# Patient Record
Sex: Male | Born: 1995 | Race: Black or African American | Hispanic: No | Marital: Single | Smoking: Never smoker
Health system: Southern US, Community
[De-identification: ages and names within clinical notes are randomized; demographics above are authoritative.]

---
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
## 2018-06-24 ENCOUNTER — Other Ambulatory Visit: Payer: Self-pay

## 2018-06-24 ENCOUNTER — Encounter (HOSPITAL_COMMUNITY): Payer: Self-pay | Admitting: Emergency Medicine

## 2018-06-24 ENCOUNTER — Emergency Department (HOSPITAL_COMMUNITY)
Admission: EM | Admit: 2018-06-24 | Discharge: 2018-06-24 | Disposition: A | Discharge status: Home / Self Care | Payer: Self-pay | Attending: Emergency Medicine | Admitting: Emergency Medicine

## 2018-06-24 ENCOUNTER — Emergency Department (HOSPITAL_COMMUNITY): Payer: Self-pay

## 2018-06-24 DIAGNOSIS — J36 Peritonsillar abscess: Secondary | ICD-10-CM | POA: Insufficient documentation

## 2018-06-24 LAB — I-STAT CHEM 8, ED
BUN: 10 mg/dL (ref 6–20)
CALCIUM ION: 1.16 mmol/L (ref 1.15–1.40)
Chloride: 100 mmol/L (ref 98–111)
Creatinine, Ser: 0.8 mg/dL (ref 0.61–1.24)
GLUCOSE: 89 mg/dL (ref 70–99)
HEMATOCRIT: 46 % (ref 39.0–52.0)
Hemoglobin: 15.6 g/dL (ref 13.0–17.0)
Potassium: 3.8 mmol/L (ref 3.5–5.1)
SODIUM: 138 mmol/L (ref 135–145)
TCO2: 27 mmol/L (ref 22–32)

## 2018-06-24 LAB — GROUP A STREP BY PCR: GROUP A STREP BY PCR: NOT DETECTED

## 2018-06-24 MED ORDER — IOHEXOL 300 MG/ML  SOLN
100.0000 mL | Freq: Once | INTRAMUSCULAR | Status: AC | PRN
  Administered 2018-06-24: 100 mL via INTRAVENOUS

## 2018-06-24 MED ORDER — CLINDAMYCIN HCL 300 MG PO CAPS: 300.0000 mg | ORAL_CAPSULE | Freq: Three times a day (TID) | ORAL | 0 refills | Status: AC

## 2018-06-24 MED ORDER — ACETAMINOPHEN 325 MG PO TABS
650.0000 mg | ORAL_TABLET | Freq: Once | ORAL | Status: AC
Start: 2018-06-24 — End: 2018-06-24
  Administered 2018-06-24: 650 mg via ORAL
  Filled 2018-06-24: qty 2

## 2018-06-24 MED ORDER — CLINDAMYCIN PHOSPHATE 600 MG/50ML IV SOLN
600.0000 mg | Freq: Once | INTRAVENOUS | Status: AC
  Administered 2018-06-24: 600 mg via INTRAVENOUS
  Filled 2018-06-24: qty 50

## 2018-06-24 MED ORDER — DEXAMETHASONE SODIUM PHOSPHATE 10 MG/ML IJ SOLN
10.0000 mg | Freq: Once | INTRAMUSCULAR | Status: AC
  Administered 2018-06-24: 10 mg via INTRAVENOUS
  Filled 2018-06-24: qty 1

## 2018-06-24 NOTE — Discharge Instructions (Addendum)
You were given a prescription for antibiotics. Please take the antibiotic prescription fully even if you feel better you need to finish the entire prescription.   You may follow-up with the ear nose and throat doctor if your symptoms are not completely resolved.  If your symptoms worsen then you will need to return to the emergency department immediately.

## 2018-06-24 NOTE — Consult Note (Signed)
Reason for Consult:pta Referring Physician: er  Mason Pittman is an 22 y.o. male.  HPI: hx of  2 days of sore throat. It was worsening and he presented to ER, He has not had repetitive tonsillitis. No previous PTA. He had some trismus on presentation but he states he is able to open mouth fully now and can swallow. He has not had any antibiotics yet. No breathing problem.  History reviewed. No pertinent past medical history.  History reviewed. No pertinent surgical history.  No family history on file.  Social History:  reports that he has never smoked. He has never used smokeless tobacco. His alcohol and drug histories are not on file.  Allergies: No Known Allergies  Medications: I have reviewed the patient's current medications.  Results for orders placed or performed during the hospital encounter of 06/24/18 (from the past 48 hour(s))  Group A Strep by PCR     Status: None   Collection Time: 06/24/18  4:27 PM  Result Value Ref Range   Group A Strep by PCR NOT DETECTED NOT DETECTED    Comment: Performed at [facility], [address], [place], [place] [address]  I-Stat Chem 8, ED     Status: None   Collection Time: 06/24/18  6:27 PM  Result Value Ref Range   Sodium 138 135 - 145 mmol/L   Potassium 3.8 3.5 - 5.1 mmol/L   Chloride 100 98 - 111 mmol/L   BUN 10 6 - 20 mg/dL   Creatinine, Ser 6.04 0.61 - 1.24 mg/dL   Glucose, Bld 89 70 - 99 mg/dL   Calcium, Ion 5.40 9.81 - 1.40 mmol/L   TCO2 27 22 - 32 mmol/L   Hemoglobin 15.6 13.0 - 17.0 g/dL   HCT 19.1 47.8 - 29.5 %    Ct Soft Tissue Neck W Contrast  Result Date: 06/24/2018 CLINICAL DATA:  Possible strep throat, sore throat, and ear pain. Evaluate for RIGHT peritonsillar abscess. EXAM: CT NECK WITH CONTRAST TECHNIQUE: Multidetector CT imaging of the neck was performed using the standard protocol following the bolus administration of intravenous contrast. CONTRAST:  OMNIPAQUE IOHEXOL 300 MG/ML  SOLN COMPARISON:   None. FINDINGS: Pharynx and larynx: There is extensive edema and swelling of the RIGHT greater than LEFT palatine tonsil. Central area of low attenuation, approximately 1 x 2 x 2 cm, ill-defined margins, phlegmonous, concerning for RIGHT peritonsillar developing abscess. Marked BILATERAL adenoidal enlargement and edema. There is a small retropharyngeal effusion. Edema extends down along the RIGHT aryepiglottic fold. There is only minor mass effect on the airway. The larynx is normal. Salivary glands: No inflammation, mass, or stone. Thyroid: Normal. Lymph nodes: Reactive cervical lymphadenopathy, RIGHT greater than LEFT, greatest in level 2. Vascular: Patent. Limited intracranial: Negative. Visualized orbits: Negative. Mastoids and visualized paranasal sinuses: Sinuses are clear. No mastoid fluid. Skeleton: No osseous findings. Upper chest: Clear. Other: None. IMPRESSION: Developing RIGHT peritonsillar abscess, ill-defined, central hypodense area measuring 1 x 2 x 2 cm. Mild edema tracks caudally along the RIGHT aryepiglottic fold, with a small RIGHT retropharyngeal effusion, but no significant compromise of the airway. BILATERAL nasopharyngeal adenoidal enlargement and LEFT tonsillar enlargement and inflammation, with reactive cervical lymphadenopathy. Findings discussed with ordering provider. Electronically Signed   By: Elsie Stain M.D.   On: 06/24/2018 19:47    ROS Blood pressure (!) 148/97, pulse 83, temperature 99.3 F (37.4 C), temperature source Oral, resp. rate 18, height 5\' 11"  (1.803 m), weight 83.9 kg (185 lb),  SpO2 96 %. Physical Exam  Constitutional: He appears well-developed and well-nourished.  HENT:  Head: Normocephalic and atraumatic.  Awake and alert. He seems to be in no distress. He feels much better than when he came in. He has no hot potato voice. He opens mouth fully. The entire vertical length of tonsils is visible. The right tonsil is slightly bulging but the soft palate is  without significant swelling or erythema. The uvula is midline and normal. The posterior wall looks normal. Tongue without swelling.   Eyes: Pupils are equal, round, and reactive to light. Conjunctivae are normal.  Neck: Normal range of motion. Neck supple.    Assessment/Plan: PTA- his CT scan has 2x1 abscess in right tonsil. His exam is without significant swelling, voice is normal and no trismus. We discussed options of admission for IV abx, I/D, and trying outpatient abx. He prefers latter bc he is feeling so much better.  He will follow up if he is not better each day until resolution in 72 hours. Discharge on clindamycin and give 600mg  IV before he leaves. F/u in 2 weeks in office unless worse.   Mason Pittman 06/24/2018, 9:06 PM

## 2018-06-24 NOTE — ED Notes (Signed)
Pt verbalized understanding discharge instructions and denies any further needs or questions at this time. VS stable, ambulatory and steady gait.   

## 2018-06-24 NOTE — ED Triage Notes (Addendum)
Pt states that he thinks he has strep throat. Pt reports rt ear pain, sore throat and painful to swallow that started two days ago. Pt has not been around anybody with similar sx.

## 2018-06-24 NOTE — ED Notes (Signed)
Pt back from CT

## 2018-06-24 NOTE — ED Provider Notes (Signed)
[facility] Provider Note   CSN: [id number] Arrival date & time: 06/24/18  1617     History   Chief Complaint Chief Complaint  Patient presents with  . Sore Throat  . Otalgia    HPI Yannis Freida Busmanllen is a 22 y.o. male.  HPI  Patient is a 22 year old male who presents to the ED for evaluation of a sore throat that has been present for the last 2 days.  States he feels like his throat is swelling and that his voice sounds different. States he has pain with swallowing and has had difficulty eating due to this. Denies any exacerbating or alleviating factors Pt also reports right sided ear pain as well. Denies any known fevers at home. No rhinorrhea, nasal congestion, or cough.   History reviewed. No pertinent past medical history.  There are no active problems to display for this patient.   History reviewed. No pertinent surgical history.      Home Medications    Prior to Admission medications   Medication Sig Start Date End Date Taking? Authorizing Provider  clindamycin (CLEOCIN) 300 MG capsule Take 1 capsule (300 mg total) by mouth 3 (three) times daily for 10 days. 06/24/18 07/04/18  Elky Funches S, PA-C  ibuprofen (ADVIL,MOTRIN) 600 MG tablet Take 1 tab PO Q6h x 2 days then Q6h prn Patient not taking: Reported on 06/24/2018 04/10/13   Lowanda FosterBrewer, Mindy, NP    Family History No family history on file.  Social History Social History   Tobacco Use  . Smoking status: Never Smoker  . Smokeless tobacco: Never Used  Substance Use Topics  . Alcohol use: Not on file  . Drug use: Not on file     Allergies   Patient has no known allergies.   Review of Systems Review of Systems  Constitutional: Negative for chills and fever.  HENT: Positive for ear pain, sore throat, trouble swallowing and voice change. Negative for congestion and rhinorrhea.   Eyes: Negative for pain and visual disturbance.  Respiratory: Negative for cough and  shortness of breath.   Cardiovascular: Negative for chest pain.  Gastrointestinal: Negative for abdominal pain, constipation, diarrhea, nausea and vomiting.  Genitourinary: Negative for dysuria and hematuria.  Musculoskeletal: Negative for back pain.  Skin: Negative for rash.  Neurological: Negative for dizziness, weakness, light-headedness, numbness and headaches.  All other systems reviewed and are negative.    Physical Exam Updated Vital Signs BP (!) 138/91 (BP Location: Right Arm)   Pulse 64   Temp 98.4 F (36.9 C) (Oral)   Resp 16   Ht 5\' 11"  (1.803 m)   Wt 83.9 kg (185 lb)   SpO2 98%   BMI 25.80 kg/m   Physical Exam  Constitutional: He appears well-developed and well-nourished.  Appears uncomfortable  HENT:  Head: Normocephalic and atraumatic.  bilat TMs obstructed by cerumen. Pharyngeal erythema present. Right tonsil is edematous and touching the uvula, though uvula is not deviated. Left tonsil is WNL. Tolerating secretions.  Eyes: Pupils are equal, round, and reactive to light. Conjunctivae and EOM are normal.  Neck: Neck supple.  Cardiovascular: Normal rate, regular rhythm, normal heart sounds and intact distal pulses.  No murmur heard. Pulmonary/Chest: Effort normal and breath sounds normal. No respiratory distress. He has no wheezes. He has no rales.  Abdominal: Soft. Bowel sounds are normal. He exhibits no distension. There is no tenderness.  Musculoskeletal: He exhibits no edema.  Lymphadenopathy:    He has cervical adenopathy.  Neurological: He is alert.  Skin: Skin is warm and dry. Capillary refill takes less than 2 seconds.  Psychiatric: He has a normal mood and affect.  Nursing note and vitals reviewed.  ED Treatments / Results  Labs (all labs ordered are listed, but only abnormal results are displayed) Labs Reviewed  GROUP A STREP BY PCR  I-STAT CHEM 8, ED    EKG None  Radiology Ct Soft Tissue Neck W Contrast  Result Date:  06/24/2018 CLINICAL DATA:  Possible strep throat, sore throat, and ear pain. Evaluate for RIGHT peritonsillar abscess. EXAM: CT NECK WITH CONTRAST TECHNIQUE: Multidetector CT imaging of the neck was performed using the standard protocol following the bolus administration of intravenous contrast. CONTRAST:  OMNIPAQUE IOHEXOL 300 MG/ML  SOLN COMPARISON:  None. FINDINGS: Pharynx and larynx: There is extensive edema and swelling of the RIGHT greater than LEFT palatine tonsil. Central area of low attenuation, approximately 1 x 2 x 2 cm, ill-defined margins, phlegmonous, concerning for RIGHT peritonsillar developing abscess. Marked BILATERAL adenoidal enlargement and edema. There is a small retropharyngeal effusion. Edema extends down along the RIGHT aryepiglottic fold. There is only minor mass effect on the airway. The larynx is normal. Salivary glands: No inflammation, mass, or stone. Thyroid: Normal. Lymph nodes: Reactive cervical lymphadenopathy, RIGHT greater than LEFT, greatest in level 2. Vascular: Patent. Limited intracranial: Negative. Visualized orbits: Negative. Mastoids and visualized paranasal sinuses: Sinuses are clear. No mastoid fluid. Skeleton: No osseous findings. Upper chest: Clear. Other: None. IMPRESSION: Developing RIGHT peritonsillar abscess, ill-defined, central hypodense area measuring 1 x 2 x 2 cm. Mild edema tracks caudally along the RIGHT aryepiglottic fold, with a small RIGHT retropharyngeal effusion, but no significant compromise of the airway. BILATERAL nasopharyngeal adenoidal enlargement and LEFT tonsillar enlargement and inflammation, with reactive cervical lymphadenopathy. Findings discussed with ordering provider. Electronically Signed   By: Elsie Stain M.D.   On: 06/24/2018 19:47    Procedures Procedures (including critical care time)  Medications Ordered in ED Medications  dexamethasone (DECADRON) injection 10 mg (10 mg Intravenous Given 06/24/18 1843)  iohexol  (OMNIPAQUE) 300 MG/ML solution 100 mL (100 mLs Intravenous Contrast Given 06/24/18 1903)  acetaminophen (TYLENOL) tablet 650 mg (650 mg Oral Given 06/24/18 1957)  clindamycin (CLEOCIN) IVPB 600 mg (0 mg Intravenous Stopped 06/24/18 2215)     Initial Impression / Assessment and Plan / ED Course  I have reviewed the triage vital signs and the nursing notes.  Pertinent labs & imaging results that were available during my care of the patient were reviewed by me and considered in my medical decision making (see chart for details).   Spoke with radiology regarding the patients CT read.   8:19 PM CONSULT with Dr. Jearld Fenton, who will see the patient and decide if he needs to be admitted.   9:05 PM Discussed case with Dr. Jearld Fenton who evaluated the pt in the ED. he personally evaluated the patient and gave the patient the option for drainage of the abscess, admission to the hospital for IV antibiotics, or p.o. antibiotics as an outpatient.  The patient preferred outpatient treatment.  He states that patient is safe for outpatient treatment on 300mg  clindamycin TID for 10 days.  Recommended 600 mg IV Clinda prior to discharge.  Final Clinical Impressions(s) / ED Diagnoses   Final diagnoses:  Peritonsillar abscess   Patient with sore throat for the last 2 days.  Normal vital signs.  Afebrile.  On exam has unilateral right sided swelling of the tonsils  however uvula is midline.  Patient has a patent airway without hot potato voice.  He is tolerating his secretions.  Given the unilateral swelling on exam CT scan soft tissue neck was obtained which showed a 1 x 2 x 2 cm ill-defined developing right peritonsillar abscess without airway compromise.  Dr. Jearld Fenton from ENT was consulted who personally evaluated the patient in the emergency department and gave the patient the option of draining the wound, admitting to the hospital for IV antibiotics, or taking p.o. antibiotics as an outpatient.  The patient prefers to take  p.o. antibiotics as an outpatient.  Patient received 600 mg IV Clinda prior to discharge.  Discharged in stable condition.  Given strict instructions to return if worse.  Also gave referral to ENT for follow-up.  Patient voices understanding the plan and reasons to return immediately to the ED.  All questions answered.  ED Discharge Orders        Ordered    clindamycin (CLEOCIN) 300 MG capsule  3 times daily     06/24/18 2155       Karrie Meres, PA-C 06/25/18 0045    Charlynne Pander, MD 06/25/18 [phone]

## 2018-06-24 NOTE — ED Notes (Signed)
Patient In CT, at this time.

## 2018-06-26 ENCOUNTER — Emergency Department (HOSPITAL_COMMUNITY)
Admission: EM | Admit: 2018-06-26 | Discharge: 2018-06-26 | Disposition: A | Discharge status: Home / Self Care | Payer: Medicaid Other | Attending: Emergency Medicine | Admitting: Emergency Medicine

## 2018-06-26 ENCOUNTER — Encounter (HOSPITAL_COMMUNITY): Payer: Self-pay

## 2018-06-26 DIAGNOSIS — J36 Peritonsillar abscess: Secondary | ICD-10-CM | POA: Insufficient documentation

## 2018-06-26 MED ORDER — KETOROLAC TROMETHAMINE 30 MG/ML IJ SOLN
30.0000 mg | Freq: Once | INTRAMUSCULAR | Status: AC
  Administered 2018-06-26: 30 mg via INTRAMUSCULAR
  Filled 2018-06-26: qty 1

## 2018-06-26 MED ORDER — OXYCODONE-ACETAMINOPHEN 5-325 MG PO TABS
1.0000 | ORAL_TABLET | ORAL | Status: DC | PRN
  Administered 2018-06-26: 1 via ORAL
  Filled 2018-06-26: qty 1

## 2018-06-26 MED ORDER — IBUPROFEN 600 MG PO TABS: 600.0000 mg | ORAL_TABLET | Freq: Four times a day (QID) | ORAL | 0 refills | Status: AC | PRN

## 2018-06-26 MED ORDER — HYDROCODONE-ACETAMINOPHEN 7.5-325 MG/15ML PO SOLN: 10.0000 mL | Freq: Four times a day (QID) | ORAL | 0 refills | Status: AC | PRN

## 2018-06-26 NOTE — Discharge Instructions (Signed)
Please read and follow all provided instructions.  Your diagnoses today include:  1. Peritonsillar abscess     Tests performed today include: Vital signs. See below for your results today.   Medications prescribed:   You have been prescribed Hycet for pain. This is an opioid pain medication. You may take this medication every 4-6 hours as needed for pain. Only take this medication if you need it for breakthrough pain. You may combine this medicine with ibuprofen, a non-steroidal anti-inflammatory drug (NSAID) every 6 hours, so you are getting something for pain relief every 3 hours.  Do not combine this medication with Tylenol, as it may increase the risk of liver problems.  Do not combine this medication with alcohol.  Please be advised to avoid driving or operating heavy machinery while taking this medication, as it may make you drowsy or impair judgment.   Home care instructions:  Please read the educational materials provided and follow any instructions contained in this packet.  Follow-up instructions: Please follow-up with your primary care provider as needed for further evaluation of your symptoms.  Return instructions:  Please return to the Emergency Department if you experience worsening symptoms.  Return if you have worsening problems swallowing, your neck becomes swollen, you cannot swallow your saliva or your voice becomes muffled.  Return with high persistent fever, persistent vomiting, or if you have trouble breathing.  Please return if you have any other emergent concerns.  Additional Information:  Your vital signs today were: BP 131/85 (BP Location: Right Arm)    Pulse 62    Temp 98.2 F (36.8 C) (Oral)    Resp 16    SpO2 100%  If your blood pressure (BP) was elevated above 135/85 this visit, please have this repeated by your doctor within one month. --------------

## 2018-06-26 NOTE — ED Triage Notes (Signed)
Pt presents for reevaluation of peritonsilar abscess. Was here yesterday and dx. Taking abx and pain meds with no improvement.

## 2018-06-26 NOTE — ED Provider Notes (Signed)
[facility] EMERGENCY DEPARTMENT Provider Note   CSN: [id number] Arrival date & time: 06/26/18  0705     History   Chief Complaint Chief Complaint  Patient presents with  . Sore Throat    HPI Mason Pittman is a 22 y.o. male.  HPI   Patient is a 22 year old male with no significant past medical history presenting for throat pain.  Patient reports he was diagnosed with a peritonsillar abscess yesterday, seen by an ENT physician in the emergency department, and recommended follow-up with outpatient antibiotics.  Patient reports that had significantly improved pain yesterday, but this morning he woke up and was having a "ache" on the right side of his throat where the abscesses.  Patient denies taking anything prior to arrival to improve the pain.  Patient reports that he did not have pain medication, and presents for pain control.  Patient denies any difficulty breathing, difficulty swallowing, fevers, chills, nausea, vomiting, or worsening quality of his voice.  History reviewed. No pertinent past medical history.  There are no active problems to display for this patient.   History reviewed. No pertinent surgical history.      Home Medications    Prior to Admission medications   Medication Sig Start Date End Date Taking? Authorizing Provider  clindamycin (CLEOCIN) 300 MG capsule Take 1 capsule (300 mg total) by mouth 3 (three) times daily for 10 days. 06/24/18 07/04/18  Couture, Cortni S, PA-C  HYDROcodone-acetaminophen (HYCET) 7.5-325 mg/15 ml solution Take 10 mLs by mouth every 6 (six) hours as needed for moderate pain. You may take 10-15 mL every 6 hours as needed for pain. 06/26/18 06/26/19  Aviva Kluver B, PA-C  ibuprofen (ADVIL,MOTRIN) 600 MG tablet Take 1 tablet (600 mg total) by mouth every 6 (six) hours as needed. 06/26/18   Elisha Ponder, PA-C    Family History No family history on file.  Social History Social History   Tobacco Use  . Smoking  status: Never Smoker  . Smokeless tobacco: Never Used  Substance Use Topics  . Alcohol use: Not on file  . Drug use: Not on file     Allergies   Patient has no known allergies.   Review of Systems Review of Systems  Constitutional: Negative for chills and fever.  HENT: Positive for sore throat. Negative for dental problem, facial swelling and trouble swallowing.   Respiratory: Negative for shortness of breath and stridor.   Gastrointestinal: Negative for nausea and vomiting.  All other systems reviewed and are negative.    Physical Exam Updated Vital Signs BP 123/82 (BP Location: Right Arm)   Pulse 60   Temp 98.2 F (36.8 C) (Oral)   Resp 16   SpO2 100%   Physical Exam  Constitutional: He appears well-developed and well-nourished. No distress.  HENT:  Head: Normocephalic and atraumatic.  Mouth/Throat: Oropharynx is clear and moist. Tonsils are 3+ on the right. Tonsils are 1+ on the left.  Normal phonation. No muffled voice sounds. Patient swallows secretions without difficulty. Dentition normal. No lesions of tongue or buccal mucosa. Uvula midline.  Asymmetric swelling of the posterior pharynx with 3+ tonsillar edema on the right and edema of the anterior tonsillar pillar. Erythema of posterior pharynx diffusely. No tonsillar exuduate. No lingual swelling. No induration inferior to tongue. No submandibular tenderness, swelling, or induration.  Tissues of the neck supple. No cervical lymphadenopathy.  Eyes: Pupils are equal, round, and reactive to light. Conjunctivae and EOM are normal.  Neck: Normal  range of motion. Neck supple.  Cardiovascular: Normal rate, regular rhythm, S1 normal and S2 normal.  No murmur heard. Pulmonary/Chest: Effort normal and breath sounds normal. He has no wheezes. He has no rales.  Abdominal: He exhibits no distension.  Musculoskeletal: Normal range of motion. He exhibits no edema or deformity.  Lymphadenopathy:    He has no cervical  adenopathy.  Neurological: He is alert.  Cranial nerves grossly intact. Patient moves extremities symmetrically and with good coordination.  Skin: Skin is warm and dry. No rash noted. No erythema.  Psychiatric: He has a normal mood and affect. His behavior is normal. Judgment and thought content normal.  Nursing note and vitals reviewed.    ED Treatments / Results  Labs (all labs ordered are listed, but only abnormal results are displayed) Labs Reviewed - No data to display  EKG None  Radiology Ct Soft Tissue Neck W Contrast  Result Date: 06/24/2018 CLINICAL DATA:  Possible strep throat, sore throat, and ear pain. Evaluate for RIGHT peritonsillar abscess. EXAM: CT NECK WITH CONTRAST TECHNIQUE: Multidetector CT imaging of the neck was performed using the standard protocol following the bolus administration of intravenous contrast. CONTRAST:  OMNIPAQUE IOHEXOL 300 MG/ML  SOLN COMPARISON:  None. FINDINGS: Pharynx and larynx: There is extensive edema and swelling of the RIGHT greater than LEFT palatine tonsil. Central area of low attenuation, approximately 1 x 2 x 2 cm, ill-defined margins, phlegmonous, concerning for RIGHT peritonsillar developing abscess. Marked BILATERAL adenoidal enlargement and edema. There is a small retropharyngeal effusion. Edema extends down along the RIGHT aryepiglottic fold. There is only minor mass effect on the airway. The larynx is normal. Salivary glands: No inflammation, mass, or stone. Thyroid: Normal. Lymph nodes: Reactive cervical lymphadenopathy, RIGHT greater than LEFT, greatest in level 2. Vascular: Patent. Limited intracranial: Negative. Visualized orbits: Negative. Mastoids and visualized paranasal sinuses: Sinuses are clear. No mastoid fluid. Skeleton: No osseous findings. Upper chest: Clear. Other: None. IMPRESSION: Developing RIGHT peritonsillar abscess, ill-defined, central hypodense area measuring 1 x 2 x 2 cm. Mild edema tracks caudally along the  RIGHT aryepiglottic fold, with a small RIGHT retropharyngeal effusion, but no significant compromise of the airway. BILATERAL nasopharyngeal adenoidal enlargement and LEFT tonsillar enlargement and inflammation, with reactive cervical lymphadenopathy. Findings discussed with ordering provider. Electronically Signed   By: Elsie Stain M.D.   On: 06/24/2018 19:47    Procedures Procedures (including critical care time)  Medications Ordered in ED Medications  oxyCODONE-acetaminophen (PERCOCET/ROXICET) 5-325 MG per tablet 1 tablet (1 tablet Oral Given 06/26/18 0717)  ketorolac (TORADOL) 30 MG/ML injection 30 mg (30 mg Intramuscular Given 06/26/18 0954)     Initial Impression / Assessment and Plan / ED Course  I have reviewed the triage vital signs and the nursing notes.  Pertinent labs & imaging results that were available during my care of the patient were reviewed by me and considered in my medical decision making (see chart for details).  Clinical Course as of Jun 26 1004  Sun Jun 26, 2018  0949 I have reviewed the patient's information in the [place] Controlled Substance Database for the past 12 months and found them to have no Rx on record.  Opiates were prescribed for an acute, painful condition. The patient was given information on side effects and encouraged to use other, non-opiate pain medication primary, only using opiate medicine sparingly for severe pain.   [AM]    Clinical Course User Index [AM] Elisha Ponder, PA-C  Patient is nontoxic-appearing, afebrile, and with intact airway.  Patient with no airway complaints or pain at this time after given Percocet in triage.  Patient purely presenting for pain control, and denies requesting or requiring further intervention beyond the oral antibiotics given to him yesterday.  Given patient's well appearance, and clinical improvement with pain control, feel that continuing outpatient therapy conservatively is appropriate.  No  signs of more advanced infection, retropharyngeal abscess, or Ludwig's angina developing since CT scan yesterday.  Patient given Toradol in the emergency department, and prescribed Hycet for outpatient.  Patient instructed not to drive, drink alcohol, or operate machinery while taking this medication.  Patient is to follow-up with Dr. Jearld FentonByers of ENT for reassessment.  Patient is in understanding and agrees with plan of care.  Final Clinical Impressions(s) / ED Diagnoses   Final diagnoses:  Peritonsillar abscess    ED Discharge Orders        Ordered    HYDROcodone-acetaminophen (HYCET) 7.5-325 mg/15 ml solution  Every 6 hours PRN     06/26/18 0949    ibuprofen (ADVIL,MOTRIN) 600 MG tablet  Every 6 hours PRN     06/26/18 0949       Elisha PonderMurray, Simcha Farrington B, PA-C 06/26/18 1010    Terrilee FilesButler, Michael C, MD 06/26/18 1824

## 2018-06-26 NOTE — ED Notes (Signed)
Patient states here for pain medication because did not receive any at discharge.

## 2019-04-17 ENCOUNTER — Emergency Department (HOSPITAL_COMMUNITY)
Admission: EM | Admit: 2019-04-17 | Discharge: 2019-04-17 | Disposition: A | Discharge status: Home / Self Care | Payer: Medicaid Other | Attending: Emergency Medicine | Admitting: Emergency Medicine

## 2019-04-17 ENCOUNTER — Encounter (HOSPITAL_COMMUNITY): Payer: Self-pay | Admitting: Emergency Medicine

## 2019-04-17 ENCOUNTER — Other Ambulatory Visit: Payer: Self-pay

## 2019-04-17 DIAGNOSIS — J029 Acute pharyngitis, unspecified: Secondary | ICD-10-CM

## 2019-04-17 LAB — GROUP A STREP BY PCR: Group A Strep by PCR: NOT DETECTED

## 2019-04-17 MED ORDER — ACETAMINOPHEN 325 MG PO TABS
650.0000 mg | ORAL_TABLET | Freq: Once | ORAL | Status: AC
  Administered 2019-04-17: 15:00:00 650 mg via ORAL
  Filled 2019-04-17: qty 2

## 2019-04-17 NOTE — ED Triage Notes (Signed)
Pt in with sore throat x 2 days, has R tonsillar swelling. Denies any fevers, cough or sob

## 2019-04-17 NOTE — ED Provider Notes (Signed)
[facility] Provider Note   CSN: [id number] Arrival date & time: 04/17/19  1308   History   Chief Complaint Chief Complaint  Patient presents with  . Sore Throat    HPI Mason Pittman is a 23 y.o. male.     HPI    23 year old male presents today with complaints of sore throat.  Patient notes painful swallowing for the last 2 days.  No difficulty swallowing, denies any fever neck stiffness.  No close sick contacts.  He notes history of the same.  No medications prior to arrival.  No known covid exposure.   History reviewed. No pertinent past medical history.  There are no active problems to display for this patient.   History reviewed. No pertinent surgical history.      Home Medications    Prior to Admission medications   Medication Sig Start Date End Date Taking? Authorizing Provider  HYDROcodone-acetaminophen (HYCET) 7.5-325 mg/15 ml solution Take 10 mLs by mouth every 6 (six) hours as needed for moderate pain. You may take 10-15 mL every 6 hours as needed for pain. 06/26/18 06/26/19  Aviva KluverMurray, Alyssa B, PA-C  ibuprofen (ADVIL,MOTRIN) 600 MG tablet Take 1 tablet (600 mg total) by mouth every 6 (six) hours as needed. 06/26/18   Elisha PonderMurray, Alyssa B, PA-C    Family History No family history on file.  Social History Social History   Tobacco Use  . Smoking status: Never Smoker  . Smokeless tobacco: Never Used  Substance Use Topics  . Alcohol use: Not on file  . Drug use: Not on file     Allergies   Patient has no known allergies.   Review of Systems Review of Systems  All other systems reviewed and are negative.    Physical Exam Updated Vital Signs BP (!) 143/100 (BP Location: Right Arm)   Pulse 63   Temp 98.1 F (36.7 C) (Oral)   Resp 14   Wt 83.9 kg   SpO2 99%   BMI 25.80 kg/m   Physical Exam Vitals signs and nursing note reviewed.  Constitutional:      Appearance: He is well-developed.  HENT:     Head:  Normocephalic and atraumatic.     Comments: Minor erythema noted to the posterior oropharynx, no significant swelling or edema, no exudate, voice normal, neck supple, no tender lymphadenopathy Eyes:     General: No scleral icterus.       Right eye: No discharge.        Left eye: No discharge.     Conjunctiva/sclera: Conjunctivae normal.     Pupils: Pupils are equal, round, and reactive to light.  Neck:     Musculoskeletal: Normal range of motion.     Vascular: No JVD.     Trachea: No tracheal deviation.  Pulmonary:     Effort: Pulmonary effort is normal.     Breath sounds: No stridor.  Neurological:     Mental Status: He is alert and oriented to person, place, and time.     Coordination: Coordination normal.  Psychiatric:        Behavior: Behavior normal.        Thought Content: Thought content normal.        Judgment: Judgment normal.      ED Treatments / Results  Labs (all labs ordered are listed, but only abnormal results are displayed) Labs Reviewed  GROUP A STREP BY PCR    EKG None  Radiology No results found.  Procedures Procedures (including critical care time)  Medications Ordered in ED Medications  acetaminophen (TYLENOL) tablet 650 mg (650 mg Oral Given 04/17/19 1432)     Initial Impression / Assessment and Plan / ED Course  I have reviewed the triage vital signs and the nursing notes.  Pertinent labs & imaging results that were available during my care of the patient were reviewed by me and considered in my medical decision making (see chart for details).        Patient's presentation is most consistent with viral pharyngitis.  Negative strep, well-appearing in no acute distress.  Discharged symptomatic care and strict return precautions.  He verbalized understanding and agreement to today's plan.  Final Clinical Impressions(s) / ED Diagnoses   Final diagnoses:  Viral pharyngitis    ED Discharge Orders    None       Rosalio Loud  04/17/19 1507    Gerhard Munch, MD 04/18/19 [phone]

## 2019-04-17 NOTE — Discharge Instructions (Addendum)
Please read attached information. If you experience any new or worsening signs or symptoms please return to the emergency room for evaluation. Please follow-up with your primary care provider or specialist as discussed.  °

## 2019-06-13 ENCOUNTER — Emergency Department (HOSPITAL_COMMUNITY): Payer: Self-pay

## 2019-06-13 ENCOUNTER — Emergency Department (HOSPITAL_COMMUNITY)
Admission: EM | Admit: 2019-06-13 | Discharge: 2019-06-13 | Disposition: A | Discharge status: Home / Self Care | Payer: Self-pay | Attending: Emergency Medicine | Admitting: Emergency Medicine

## 2019-06-13 DIAGNOSIS — Y929 Unspecified place or not applicable: Secondary | ICD-10-CM | POA: Insufficient documentation

## 2019-06-13 DIAGNOSIS — Y939 Activity, unspecified: Secondary | ICD-10-CM | POA: Insufficient documentation

## 2019-06-13 DIAGNOSIS — Y999 Unspecified external cause status: Secondary | ICD-10-CM | POA: Insufficient documentation

## 2019-06-13 DIAGNOSIS — S51832A Puncture wound without foreign body of left forearm, initial encounter: Secondary | ICD-10-CM | POA: Insufficient documentation

## 2019-06-13 DIAGNOSIS — R109 Unspecified abdominal pain: Secondary | ICD-10-CM | POA: Insufficient documentation

## 2019-06-13 DIAGNOSIS — S71131A Puncture wound without foreign body, right thigh, initial encounter: Secondary | ICD-10-CM | POA: Insufficient documentation

## 2019-06-13 DIAGNOSIS — S31139A Puncture wound of abdominal wall without foreign body, unspecified quadrant without penetration into peritoneal cavity, initial encounter: Secondary | ICD-10-CM | POA: Insufficient documentation

## 2019-06-13 DIAGNOSIS — R42 Dizziness and giddiness: Secondary | ICD-10-CM | POA: Insufficient documentation

## 2019-06-13 DIAGNOSIS — W3400XA Accidental discharge from unspecified firearms or gun, initial encounter: Secondary | ICD-10-CM | POA: Insufficient documentation

## 2019-06-13 DIAGNOSIS — S21132A Puncture wound without foreign body of left front wall of thorax without penetration into thoracic cavity, initial encounter: Secondary | ICD-10-CM | POA: Insufficient documentation

## 2019-06-13 LAB — PREPARE FRESH FROZEN PLASMA
Unit division: 0
Unit division: 0

## 2019-06-13 LAB — CBC
HCT: 51 % (ref 39.0–52.0)
Hemoglobin: 16.8 g/dL (ref 13.0–17.0)
MCH: 30.2 pg (ref 26.0–34.0)
MCHC: 32.9 g/dL (ref 30.0–36.0)
MCV: 91.7 fL (ref 80.0–100.0)
Platelets: 299 10*3/uL (ref 150–400)
RBC: 5.56 MIL/uL (ref 4.22–5.81)
RDW: 12.9 % (ref 11.5–15.5)
WBC: 8.5 10*3/uL (ref 4.0–10.5)
nRBC: 0 % (ref 0.0–0.2)

## 2019-06-13 LAB — COMPREHENSIVE METABOLIC PANEL
ALT: 17 U/L (ref 0–44)
AST: 26 U/L (ref 15–41)
Albumin: 4.7 g/dL (ref 3.5–5.0)
Alkaline Phosphatase: 48 U/L (ref 38–126)
Anion gap: 21 — ABNORMAL HIGH (ref 5–15)
BUN: 14 mg/dL (ref 6–20)
CO2: 13 mmol/L — ABNORMAL LOW (ref 22–32)
Calcium: 9.8 mg/dL (ref 8.9–10.3)
Chloride: 106 mmol/L (ref 98–111)
Creatinine, Ser: 1.48 mg/dL — ABNORMAL HIGH (ref 0.61–1.24)
GFR calc Af Amer: 33 mL/min — ABNORMAL LOW (ref >60)
GFR calc non Af Amer: 28 mL/min — ABNORMAL LOW (ref >60)
Glucose, Bld: 139 mg/dL — ABNORMAL HIGH (ref 70–99)
Potassium: 3.6 mmol/L (ref 3.5–5.1)
Sodium: 140 mmol/L (ref 135–145)
Total Bilirubin: 1.7 mg/dL — ABNORMAL HIGH (ref 0.3–1.2)
Total Protein: 8.2 g/dL — ABNORMAL HIGH (ref 6.5–8.1)

## 2019-06-13 LAB — BPAM FFP
Blood Product Expiration Date: 202007072359
Blood Product Expiration Date: 202007072359
ISSUE DATE / TIME: 202007071607
ISSUE DATE / TIME: 202007071607
Unit Type and Rh: 6200
Unit Type and Rh: 6200

## 2019-06-13 LAB — PROTIME-INR
INR: 1.1 (ref 0.8–1.2)
Prothrombin Time: 14 seconds (ref 11.4–15.2)

## 2019-06-13 LAB — I-STAT CHEM 8, ED
BUN: 14 mg/dL (ref 6–20)
Calcium, Ion: 1.06 mmol/L — ABNORMAL LOW (ref 1.15–1.40)
Chloride: 107 mmol/L (ref 98–111)
Creatinine, Ser: 1.1 mg/dL (ref 0.61–1.24)
Glucose, Bld: 135 mg/dL — ABNORMAL HIGH (ref 70–99)
HCT: 52 % (ref 39.0–52.0)
Hemoglobin: 17.7 g/dL — ABNORMAL HIGH (ref 13.0–17.0)
Potassium: 3.4 mmol/L — ABNORMAL LOW (ref 3.5–5.1)
Sodium: 138 mmol/L (ref 135–145)
TCO2: 13 mmol/L — ABNORMAL LOW (ref 22–32)

## 2019-06-13 LAB — CDS SEROLOGY

## 2019-06-13 LAB — LACTIC ACID, PLASMA: Lactic Acid, Venous: 10.6 mmol/L (ref 0.5–1.9)

## 2019-06-13 LAB — ETHANOL: Alcohol, Ethyl (B): <10 mg/dL (ref <10)

## 2019-06-13 LAB — ABO/RH: ABO/RH(D): O POS

## 2019-06-13 MED ORDER — FENTANYL CITRATE (PF) 100 MCG/2ML IJ SOLN: 50.0000 ug | Freq: Once | INTRAMUSCULAR | Status: DC

## 2019-06-13 MED ORDER — FENTANYL CITRATE (PF) 100 MCG/2ML IJ SOLN
INTRAMUSCULAR | Status: AC | PRN
  Administered 2019-06-13: 50 ug via INTRAVENOUS

## 2019-06-13 MED ORDER — TETANUS-DIPHTHERIA TOXOIDS TD 5-2 LFU IM INJ
0.5000 mL | INJECTION | Freq: Once | INTRAMUSCULAR | Status: DC
  Filled 2019-06-13: qty 0.5

## 2019-06-13 MED ORDER — HYDROMORPHONE HCL 1 MG/ML IJ SOLN
0.5000 mg | Freq: Once | INTRAMUSCULAR | Status: AC
  Administered 2019-06-13: 0.5 mg via INTRAVENOUS

## 2019-06-13 MED ORDER — HYDROCODONE-ACETAMINOPHEN 5-325 MG PO TABS: 1.0000 | ORAL_TABLET | ORAL | 0 refills | Status: AC | PRN

## 2019-06-13 MED ORDER — HYDROMORPHONE HCL 1 MG/ML IJ SOLN
INTRAMUSCULAR | Status: AC
  Filled 2019-06-13: qty 1

## 2019-06-13 MED ORDER — FENTANYL CITRATE (PF) 100 MCG/2ML IJ SOLN
INTRAMUSCULAR | Status: AC
  Filled 2019-06-13: qty 2

## 2019-06-13 MED ORDER — IOHEXOL 300 MG/ML  SOLN
100.0000 mL | Freq: Once | INTRAMUSCULAR | Status: AC | PRN
  Administered 2019-06-13: 100 mL via INTRAVENOUS

## 2019-06-13 NOTE — Progress Notes (Signed)
Orthopedic Tech Progress Note Patient Details:  Mason Pittman 20-Feb-1996 [id number]  Ortho Devices Ortho Device/Splint Location: Level one trauma       [place] Pink 06/13/2019, 6:19 PM
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
## 2019-06-14 LAB — TYPE AND SCREEN
ABO/RH(D): O POS
Antibody Screen: NEGATIVE
Unit division: 0
Unit division: 0

## 2019-06-14 LAB — BPAM RBC
Blood Product Expiration Date: 202007142359
Blood Product Expiration Date: 202007172359
ISSUE DATE / TIME: 202007071607
ISSUE DATE / TIME: 202007071607
Unit Type and Rh: 9500
Unit Type and Rh: 9500

## 2019-12-10 IMAGING — CT CT NECK W/ CM
4 series · 14 of 33 positions shown, 17 images · IV contrast (Omni 300)
Comparison: None.

CLINICAL DATA: Possible strep throat, sore throat, and ear pain.
Evaluate for RIGHT peritonsillar abscess.

EXAM:
CT NECK WITH CONTRAST
TECHNIQUE: Multidetector CT imaging of the neck was performed using the
standard protocol following the bolus administration of intravenous
contrast.
CONTRAST:  100mL OMNIPAQUE IOHEXOL 300 MG/ML  SOLN

[Series 3: neck 2.0 st · axial · 0.46mm/px · z∈[-347,-171]mm · 5 of 133 slices shown, 7 images (1 of 3)]
[im 23/133  soft-tissue]
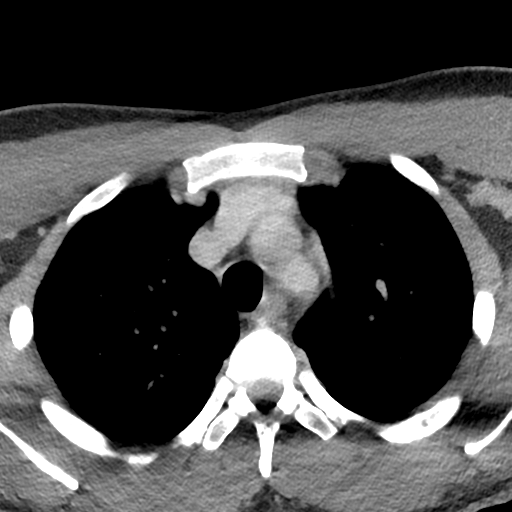
[im 23/133  bone]
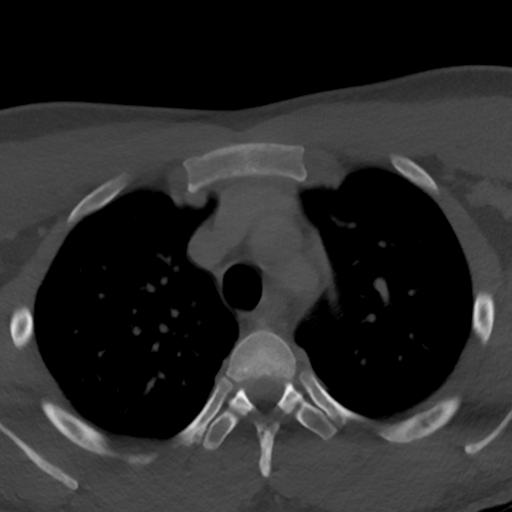
[im 45/133  bone]
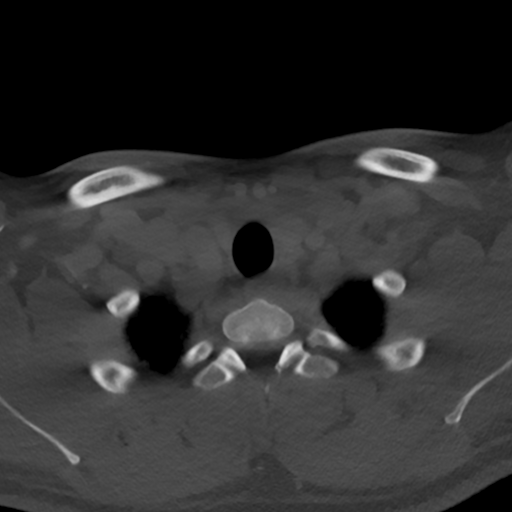
[im 67/133  bone]
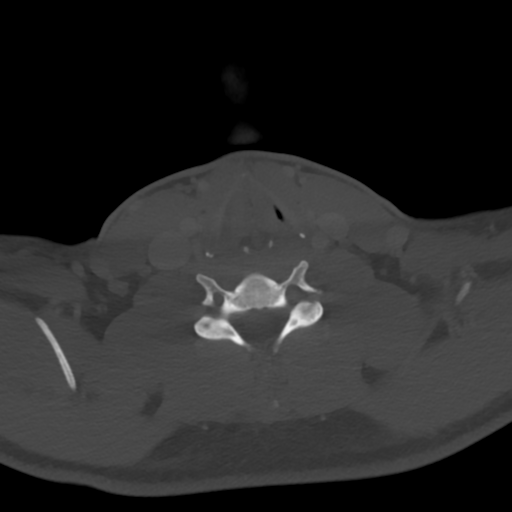
[im 89/133  bone]
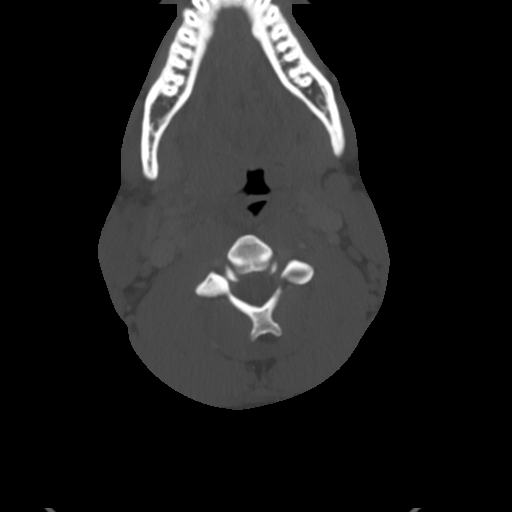
[im 111/133  soft-tissue]
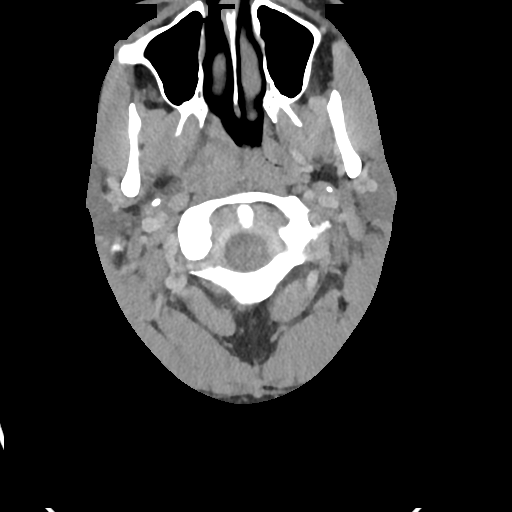
[im 111/133  bone]
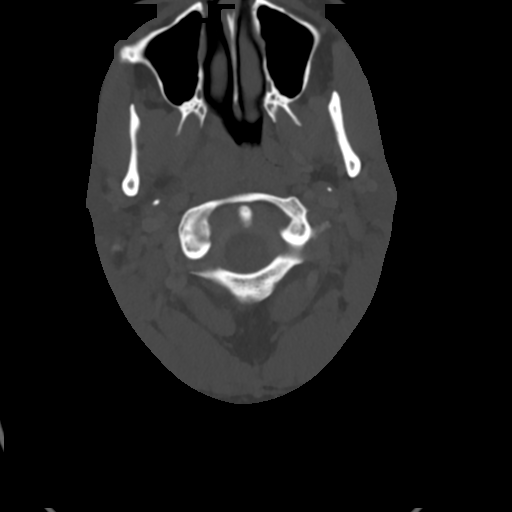

[Series 5: neck 2.0 st · sagittal · 0.52mm/px · 5 of 90 slices shown, 6 images (2 of 3)]
[im 30/90  bone]
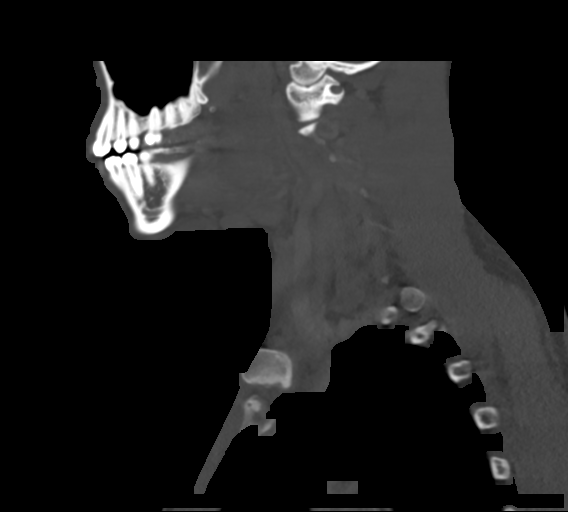
[im 38/90  bone]
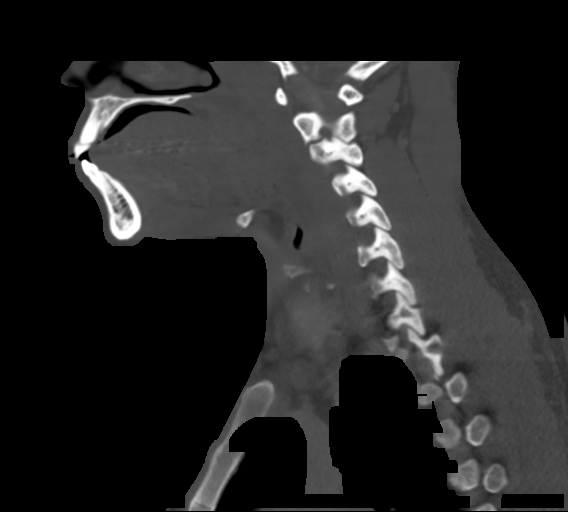
[im 45/90  soft-tissue]
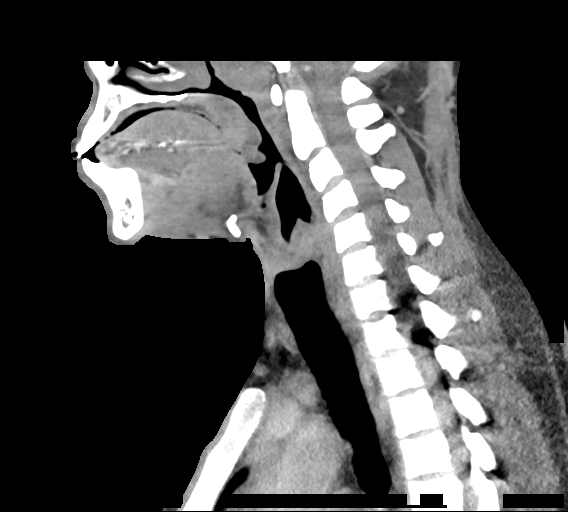
[im 45/90  bone]
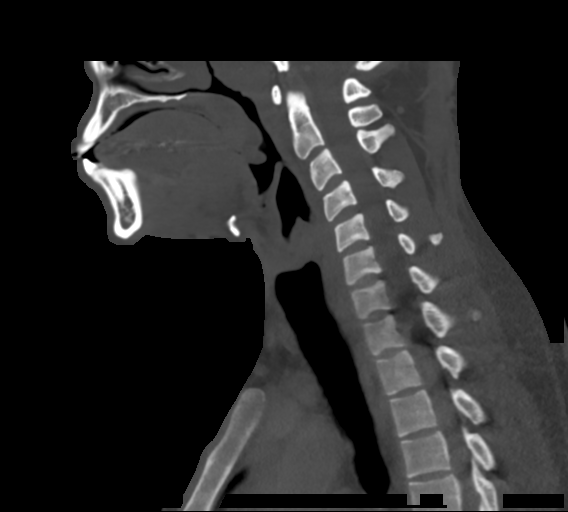
[im 52/90  bone]
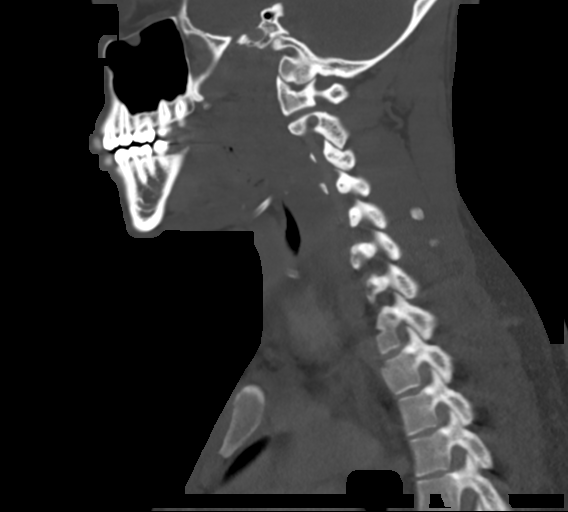
[im 60/90  bone]
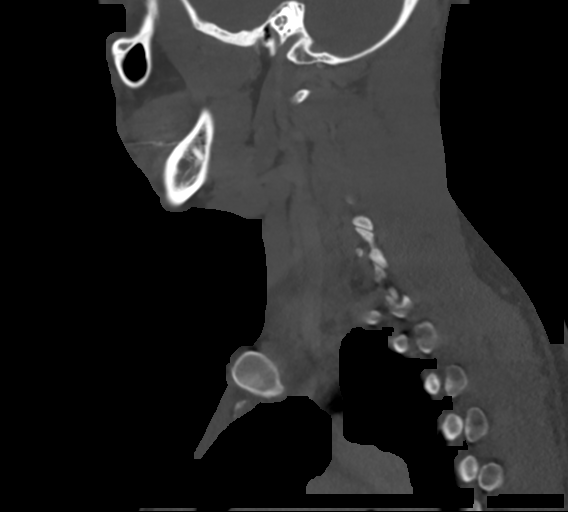

[Series 6: neck 2.0 st · coronal · 0.52mm/px · 3 of 120 slices shown (3 of 3)]
[im 24/120  bone]
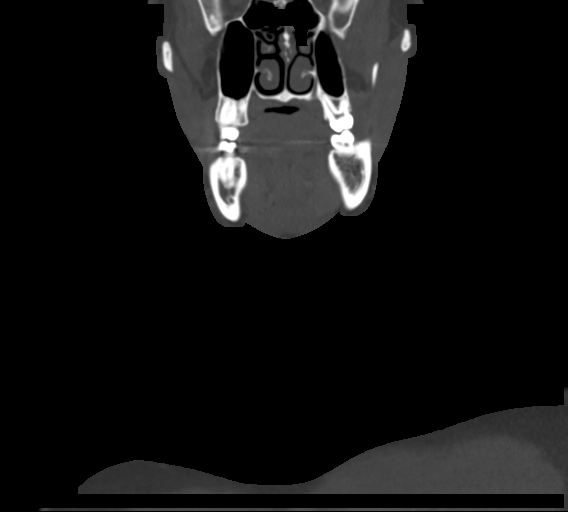
[im 48/120  bone]
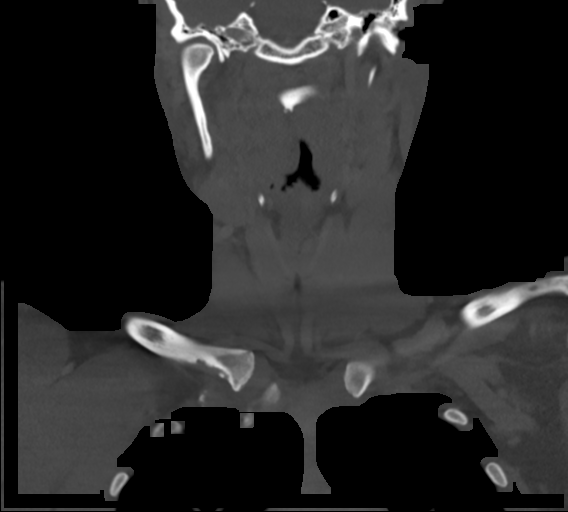
[im 72/120  bone]
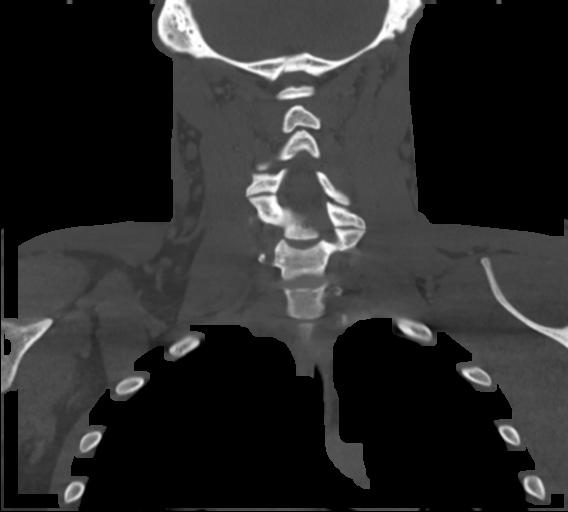

[Series 7: neck 2.0 st orthogonal · axial · 0.39mm/px · 1 of 132 slices shown]
[im 22/132  bone]
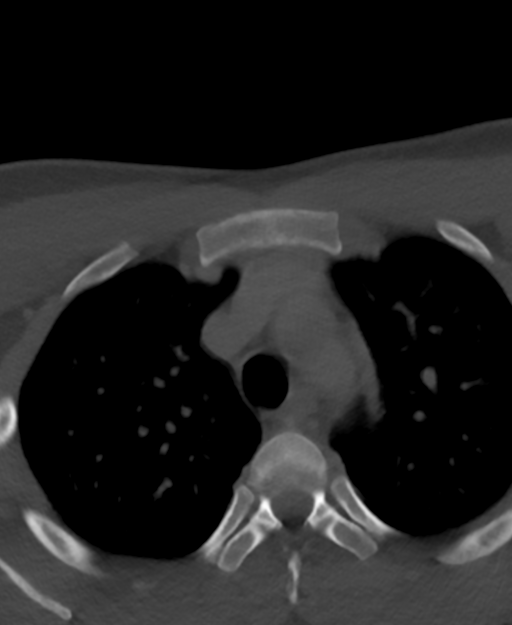

[14 of 33 positions shown; findings below may reference images not displayed]

FINDINGS: Pharynx and larynx: There is extensive edema and swelling of the
RIGHT greater than LEFT palatine tonsil. Central area of low
attenuation, approximately 1 x 2 x 2 cm, ill-defined margins,
phlegmonous, concerning for RIGHT peritonsillar developing abscess.
Marked BILATERAL adenoidal enlargement and edema. There is a small
retropharyngeal effusion. Edema extends down along the RIGHT
aryepiglottic fold. There is only minor mass effect on the airway.
The larynx is normal.

Salivary glands: No inflammation, mass, or stone.

Thyroid: Normal.

Lymph nodes: Reactive cervical lymphadenopathy, RIGHT greater than
LEFT, greatest in level 2.

Vascular: Patent.

Limited intracranial: Negative.

Visualized orbits: Negative.

Mastoids and visualized paranasal sinuses: Sinuses are clear. No
mastoid fluid.

Skeleton: No osseous findings.

Upper chest: Clear.

Other: None.
IMPRESSION: Developing RIGHT peritonsillar abscess, ill-defined, central
hypodense area measuring 1 x 2 x 2 cm. Mild edema tracks caudally
along the RIGHT aryepiglottic fold, with a small RIGHT
retropharyngeal effusion, but no significant compromise of the
airway.

BILATERAL nasopharyngeal adenoidal enlargement and LEFT tonsillar
enlargement and inflammation, with reactive cervical
lymphadenopathy.

Findings discussed with ordering provider.
